# Patient Record
Sex: Male | Born: 1940 | Race: White | Hispanic: No | Marital: Married
Health system: Southern US, Community
[De-identification: ages and names within clinical notes are randomized; demographics above are authoritative.]

---

## 2014-08-22 ENCOUNTER — Inpatient Hospital Stay: Payer: Self-pay | Admitting: Surgery

## 2014-08-22 LAB — URINALYSIS, COMPLETE
BACTERIA: NONE SEEN
BILIRUBIN, UR: NEGATIVE
Glucose,UR: >=500 mg/dL (ref 0–75)
KETONE: NEGATIVE
LEUKOCYTE ESTERASE: NEGATIVE
Nitrite: NEGATIVE
PROTEIN: NEGATIVE
Ph: 6 (ref 4.5–8.0)
RBC,UR: 1 /HPF (ref 0–5)
SPECIFIC GRAVITY: 1.007 (ref 1.003–1.030)
Squamous Epithelial: 1
WBC UR: <1 /HPF (ref 0–5)

## 2014-08-22 LAB — CBC WITH DIFFERENTIAL/PLATELET
BASOS ABS: 0 10*3/uL (ref 0.0–0.1)
BASOS PCT: 0.1 %
EOS PCT: 0.2 %
Eosinophil #: 0 10*3/uL (ref 0.0–0.7)
HCT: 43.6 % (ref 40.0–52.0)
HGB: 14.7 g/dL (ref 13.0–18.0)
LYMPHS PCT: 5.4 %
Lymphocyte #: 0.2 10*3/uL — ABNORMAL LOW (ref 1.0–3.6)
MCH: 32 pg (ref 26.0–34.0)
MCHC: 33.7 g/dL (ref 32.0–36.0)
MCV: 95 fL (ref 80–100)
Monocyte #: 0 x10 3/mm — ABNORMAL LOW (ref 0.2–1.0)
Monocyte %: 0.9 %
Neutrophil #: 3.8 10*3/uL (ref 1.4–6.5)
Neutrophil %: 93.4 %
PLATELETS: 168 10*3/uL (ref 150–440)
RBC: 4.6 10*6/uL (ref 4.40–5.90)
RDW: 14.1 % (ref 11.5–14.5)
WBC: 4 10*3/uL (ref 3.8–10.6)

## 2014-08-22 LAB — MAGNESIUM: Magnesium: 1.8 mg/dL

## 2014-08-22 LAB — COMPREHENSIVE METABOLIC PANEL
ALBUMIN: 4 g/dL (ref 3.4–5.0)
ALT: 123 U/L — AB
Alkaline Phosphatase: 208 U/L — ABNORMAL HIGH
Anion Gap: 12 (ref 7–16)
BILIRUBIN TOTAL: 4.1 mg/dL — AB (ref 0.2–1.0)
BUN: 26 mg/dL — ABNORMAL HIGH (ref 7–18)
CHLORIDE: 101 mmol/L (ref 98–107)
CREATININE: 1.51 mg/dL — AB (ref 0.60–1.30)
Calcium, Total: 8.8 mg/dL (ref 8.5–10.1)
Co2: 25 mmol/L (ref 21–32)
EGFR (African American): 52 — ABNORMAL LOW
GFR CALC NON AF AMER: 45 — AB
GLUCOSE: 229 mg/dL — AB (ref 65–99)
Osmolality: 288 (ref 275–301)
POTASSIUM: 2.4 mmol/L — AB (ref 3.5–5.1)
SGOT(AST): 250 U/L — ABNORMAL HIGH (ref 15–37)
Sodium: 138 mmol/L (ref 136–145)
Total Protein: 7.5 g/dL (ref 6.4–8.2)

## 2014-08-22 LAB — HEPATIC FUNCTION PANEL A (ARMC)
ALBUMIN: 4.1 g/dL (ref 3.4–5.0)
AST: 254 U/L — AB (ref 15–37)
Alkaline Phosphatase: 193 U/L — ABNORMAL HIGH
BILIRUBIN TOTAL: 3.6 mg/dL — AB (ref 0.2–1.0)
Bilirubin, Direct: 2.8 mg/dL — ABNORMAL HIGH (ref 0.00–0.20)
SGPT (ALT): 121 U/L — ABNORMAL HIGH
TOTAL PROTEIN: 7.4 g/dL (ref 6.4–8.2)

## 2014-08-22 LAB — LIPASE, BLOOD: Lipase: 118 U/L (ref 73–393)

## 2014-08-23 LAB — COMPREHENSIVE METABOLIC PANEL
ALT: 162 U/L — AB
Albumin: 2.9 g/dL — ABNORMAL LOW (ref 3.4–5.0)
Alkaline Phosphatase: 191 U/L — ABNORMAL HIGH
Anion Gap: 11 (ref 7–16)
BILIRUBIN TOTAL: 3.7 mg/dL — AB (ref 0.2–1.0)
BUN: 23 mg/dL — ABNORMAL HIGH (ref 7–18)
Calcium, Total: 8.6 mg/dL (ref 8.5–10.1)
Chloride: 116 mmol/L — ABNORMAL HIGH (ref 98–107)
Co2: 19 mmol/L — ABNORMAL LOW (ref 21–32)
Creatinine: 1.25 mg/dL (ref 0.60–1.30)
EGFR (African American): >60
GFR CALC NON AF AMER: 56 — AB
GLUCOSE: 132 mg/dL — AB (ref 65–99)
Osmolality: 296 (ref 275–301)
Potassium: 5.2 mmol/L — ABNORMAL HIGH (ref 3.5–5.1)
SGOT(AST): 166 U/L — ABNORMAL HIGH (ref 15–37)
Sodium: 146 mmol/L — ABNORMAL HIGH (ref 136–145)
Total Protein: 5.6 g/dL — ABNORMAL LOW (ref 6.4–8.2)

## 2014-08-23 LAB — CBC WITH DIFFERENTIAL/PLATELET
Basophil #: 0 10*3/uL (ref 0.0–0.1)
Basophil %: 0.1 %
EOS ABS: 0 10*3/uL (ref 0.0–0.7)
Eosinophil %: 0.3 %
HCT: 40 % (ref 40.0–52.0)
HGB: 13.1 g/dL (ref 13.0–18.0)
LYMPHS ABS: 0.7 10*3/uL — AB (ref 1.0–3.6)
Lymphocyte %: 8 %
MCH: 32.2 pg (ref 26.0–34.0)
MCHC: 32.7 g/dL (ref 32.0–36.0)
MCV: 99 fL (ref 80–100)
MONOS PCT: 3.2 %
Monocyte #: 0.3 x10 3/mm (ref 0.2–1.0)
NEUTROS ABS: 7.9 10*3/uL — AB (ref 1.4–6.5)
NEUTROS PCT: 88.4 %
PLATELETS: 108 10*3/uL — AB (ref 150–440)
RBC: 4.06 10*6/uL — ABNORMAL LOW (ref 4.40–5.90)
RDW: 15.4 % — AB (ref 11.5–14.5)
WBC: 9 10*3/uL (ref 3.8–10.6)

## 2014-08-23 LAB — HEPATIC FUNCTION PANEL A (ARMC): Bilirubin, Direct: 2.2 mg/dL — ABNORMAL HIGH (ref 0.00–0.20)

## 2014-08-24 LAB — CBC WITH DIFFERENTIAL/PLATELET
BASOS PCT: 0.3 %
Basophil #: 0 10*3/uL (ref 0.0–0.1)
EOS PCT: 0.2 %
Eosinophil #: 0 10*3/uL (ref 0.0–0.7)
HCT: 40.9 % (ref 40.0–52.0)
HGB: 13.7 g/dL (ref 13.0–18.0)
LYMPHS PCT: 7.8 %
Lymphocyte #: 0.5 10*3/uL — ABNORMAL LOW (ref 1.0–3.6)
MCH: 32.1 pg (ref 26.0–34.0)
MCHC: 33.4 g/dL (ref 32.0–36.0)
MCV: 96 fL (ref 80–100)
Monocyte #: 0.2 x10 3/mm (ref 0.2–1.0)
Monocyte %: 3.2 %
NEUTROS ABS: 6 10*3/uL (ref 1.4–6.5)
Neutrophil %: 88.5 %
Platelet: 111 10*3/uL — ABNORMAL LOW (ref 150–440)
RBC: 4.26 10*6/uL — ABNORMAL LOW (ref 4.40–5.90)
RDW: 15 % — AB (ref 11.5–14.5)
WBC: 6.8 10*3/uL (ref 3.8–10.6)

## 2014-08-24 LAB — BASIC METABOLIC PANEL
ANION GAP: 9 (ref 7–16)
BUN: 22 mg/dL — ABNORMAL HIGH (ref 7–18)
CREATININE: 1.26 mg/dL (ref 0.60–1.30)
Calcium, Total: 9.4 mg/dL (ref 8.5–10.1)
Chloride: 111 mmol/L — ABNORMAL HIGH (ref 98–107)
Co2: 23 mmol/L (ref 21–32)
EGFR (Non-African Amer.): 55 — ABNORMAL LOW
GLUCOSE: 141 mg/dL — AB (ref 65–99)
OSMOLALITY: 291 (ref 275–301)
Potassium: 4 mmol/L (ref 3.5–5.1)
Sodium: 143 mmol/L (ref 136–145)

## 2014-08-25 LAB — COMPREHENSIVE METABOLIC PANEL
AST: 40 U/L — AB (ref 15–37)
Albumin: 2.8 g/dL — ABNORMAL LOW (ref 3.4–5.0)
Alkaline Phosphatase: 167 U/L — ABNORMAL HIGH
Anion Gap: 8 (ref 7–16)
BILIRUBIN TOTAL: 1.3 mg/dL — AB (ref 0.2–1.0)
BUN: 23 mg/dL — AB (ref 7–18)
CHLORIDE: 110 mmol/L — AB (ref 98–107)
CO2: 25 mmol/L (ref 21–32)
Calcium, Total: 8.3 mg/dL — ABNORMAL LOW (ref 8.5–10.1)
Creatinine: 1.38 mg/dL — ABNORMAL HIGH (ref 0.60–1.30)
EGFR (African American): 58 — ABNORMAL LOW
EGFR (Non-African Amer.): 50 — ABNORMAL LOW
Glucose: 164 mg/dL — ABNORMAL HIGH (ref 65–99)
OSMOLALITY: 292 (ref 275–301)
POTASSIUM: 4 mmol/L (ref 3.5–5.1)
SGPT (ALT): 92 U/L — ABNORMAL HIGH
SODIUM: 143 mmol/L (ref 136–145)
TOTAL PROTEIN: 6.6 g/dL (ref 6.4–8.2)

## 2014-08-25 LAB — CBC WITH DIFFERENTIAL/PLATELET
BASOS ABS: 0 10*3/uL (ref 0.0–0.1)
Basophil %: 0.1 %
EOS ABS: 0 10*3/uL (ref 0.0–0.7)
Eosinophil %: 0.2 %
HCT: 38.4 % — ABNORMAL LOW (ref 40.0–52.0)
HGB: 12.7 g/dL — ABNORMAL LOW (ref 13.0–18.0)
LYMPHS PCT: 12.6 %
Lymphocyte #: 0.8 10*3/uL — ABNORMAL LOW (ref 1.0–3.6)
MCH: 31.5 pg (ref 26.0–34.0)
MCHC: 33 g/dL (ref 32.0–36.0)
MCV: 96 fL (ref 80–100)
Monocyte #: 0.3 x10 3/mm (ref 0.2–1.0)
Monocyte %: 5.2 %
NEUTROS ABS: 5.3 10*3/uL (ref 1.4–6.5)
Neutrophil %: 81.9 %
Platelet: 96 10*3/uL — ABNORMAL LOW (ref 150–440)
RBC: 4.02 10*6/uL — ABNORMAL LOW (ref 4.40–5.90)
RDW: 15 % — AB (ref 11.5–14.5)
WBC: 6.5 10*3/uL (ref 3.8–10.6)

## 2014-08-25 LAB — PATHOLOGY REPORT

## 2014-08-25 LAB — HEPATIC FUNCTION PANEL A (ARMC): BILIRUBIN DIRECT: 0.8 mg/dL — AB (ref 0.00–0.20)

## 2014-08-26 LAB — CBC WITH DIFFERENTIAL/PLATELET
BASOS ABS: 0 10*3/uL (ref 0.0–0.1)
Basophil %: 0.2 %
EOS PCT: 0.5 %
Eosinophil #: 0 10*3/uL (ref 0.0–0.7)
HCT: 39.9 % — ABNORMAL LOW (ref 40.0–52.0)
HGB: 13.4 g/dL (ref 13.0–18.0)
Lymphocyte #: 0.9 10*3/uL — ABNORMAL LOW (ref 1.0–3.6)
Lymphocyte %: 14.7 %
MCH: 31.9 pg (ref 26.0–34.0)
MCHC: 33.6 g/dL (ref 32.0–36.0)
MCV: 95 fL (ref 80–100)
Monocyte #: 0.6 x10 3/mm (ref 0.2–1.0)
Monocyte %: 9.1 %
NEUTROS PCT: 75.5 %
Neutrophil #: 4.8 10*3/uL (ref 1.4–6.5)
PLATELETS: 87 10*3/uL — AB (ref 150–440)
RBC: 4.2 10*6/uL — ABNORMAL LOW (ref 4.40–5.90)
RDW: 14.9 % — AB (ref 11.5–14.5)
WBC: 6.3 10*3/uL (ref 3.8–10.6)

## 2014-08-26 LAB — AMMONIA: Ammonia, Plasma: 11 mcmol/L (ref 11–32)

## 2014-08-26 LAB — COMPREHENSIVE METABOLIC PANEL
ALK PHOS: 156 U/L — AB
ANION GAP: 12 (ref 7–16)
AST: 36 U/L (ref 15–37)
Albumin: 3.1 g/dL — ABNORMAL LOW (ref 3.4–5.0)
BUN: 19 mg/dL — ABNORMAL HIGH (ref 7–18)
Bilirubin,Total: 1.6 mg/dL — ABNORMAL HIGH (ref 0.2–1.0)
CO2: 22 mmol/L (ref 21–32)
CREATININE: 1.23 mg/dL (ref 0.60–1.30)
Calcium, Total: 8.6 mg/dL (ref 8.5–10.1)
Chloride: 106 mmol/L (ref 98–107)
EGFR (Non-African Amer.): 57 — ABNORMAL LOW
Glucose: 127 mg/dL — ABNORMAL HIGH (ref 65–99)
Osmolality: 283 (ref 275–301)
Potassium: 3.7 mmol/L (ref 3.5–5.1)
SGPT (ALT): 72 U/L — ABNORMAL HIGH
Sodium: 140 mmol/L (ref 136–145)
Total Protein: 7 g/dL (ref 6.4–8.2)

## 2014-08-26 LAB — TSH: Thyroid Stimulating Horm: 3.14 u[IU]/mL

## 2014-08-27 LAB — CBC WITH DIFFERENTIAL/PLATELET
Basophil #: 0 10*3/uL (ref 0.0–0.1)
Basophil %: 0.4 %
Eosinophil #: 0 10*3/uL (ref 0.0–0.7)
Eosinophil %: 0.2 %
HCT: 43.7 % (ref 40.0–52.0)
HGB: 14.6 g/dL (ref 13.0–18.0)
LYMPHS ABS: 1 10*3/uL (ref 1.0–3.6)
LYMPHS PCT: 19.4 %
MCH: 31.7 pg (ref 26.0–34.0)
MCHC: 33.4 g/dL (ref 32.0–36.0)
MCV: 95 fL (ref 80–100)
MONO ABS: 0.5 x10 3/mm (ref 0.2–1.0)
Monocyte %: 9.7 %
Neutrophil #: 3.7 10*3/uL (ref 1.4–6.5)
Neutrophil %: 70.3 %
PLATELETS: 110 10*3/uL — AB (ref 150–440)
RBC: 4.62 10*6/uL (ref 4.40–5.90)
RDW: 14.9 % — ABNORMAL HIGH (ref 11.5–14.5)
WBC: 5.3 10*3/uL (ref 3.8–10.6)

## 2014-08-31 LAB — CULTURE, BLOOD (SINGLE)

## 2014-11-28 DEATH — deceased

## 2015-04-21 NOTE — H&P (Signed)
   Subjective/Chief Complaint Diffuse abdominal pain, N/V x 2 weeks, worsening of pain x 1 day   History of Present Illness [name] is a pleasant 74 yo M who is a poor historian, he says due to pain.  Per his story, he presents with 1 week of diffuse abdominal pain.  Has been going on for weeks per his report but worsened x 1 day.  In addition, has been reported to be nauseated  and vomiting for the past two weeks.  No fevers/chills.  H/o chronic opiate use.  Bilirubin elevated at 4.1 with elevation of LFT.  Denies drinking use now or prior.  H/o opiate abuse.  CT shows gallstones, 4.7 cm infrarenal aortic aneurysm.   Past History COPD GERD PVD DM type II Polyneuropathy H/o opiod abuse Gout H/o prostate ca H/o chronic constipation   Code Status Full Code   Past Med/Surgical Hx:  Tobacco Use--Former:   Polyneuropathy:   Nausea/Vomiting:   Insomnia:   Constipation:   GERD:   COPD:   Peripheral Vascular Disease:   Arteriosclerosis:   Hypertension:   Chronic Pain Syndrome:   Depression:   Opiod Abuse:   Gout:   Vitamin B12 Deficiency:   Diabetes Mellitus, Type II (NIDD):   Malignant Neoplasm of Prostate:   ALLERGIES:  Prozac: Other  Shellfish: Other  Family and Social History:  Family History Unable to obtain   Social History negative tobacco, negative ETOH   Review of Systems:  Subjective/Chief Complaint Diffuse abdominal pain, nausea/vomiting   Fever/Chills No   Cough No   Sputum No   Abdominal Pain Yes   Diarrhea No   Constipation No   Nausea/Vomiting Yes   SOB/DOE No   Chest Pain No   Dysuria No   Tolerating Diet Nauseated  Vomiting   Physical Exam:  GEN well developed, well nourished, no acute distress, disheveled, critically ill appearing   HEENT pink conjunctivae, PERRL, good dentition   RESP normal resp effort  clear BS  no use of accessory muscles   CARD regular rate  no murmur   ABD positive tenderness  no hernia  soft  rigid   normal BS  Diffusely tender bilateral upper quadrants   EXTR negative cyanosis/clubbing, negative edema   SKIN normal to palpation, No rashes, No ulcers, skin turgor good   NEURO cranial nerves intact, negative rigidity, negative tremor, cog wheel, strength:   PSYCH A+O to time, place, person, poor insight    Assessment/Admission Diagnosis [name] is a pleasant 74 yo M who presents with worsening diffuse abdominal pain, nausea/vomiting and gallstones on CT scan.  LFT elevated.  Concern for choledocholithiasis vs cholangitis.   Plan Admit, hydrate.  IV antibiotics.  Labs in am.  GI consult if still elevated LFT in am.  Will likely require prophylactic cholecystectomy after duct status determined.   Electronic Signatures: Jarvis NewcomerLundquist, Reann Dobias A (MD)  (Signed 25-Aug-15 05:31)  Authored: CHIEF COMPLAINT and HISTORY, PAST MEDICAL/SURGIAL HISTORY, ALLERGIES, FAMILY AND SOCIAL HISTORY, REVIEW OF SYSTEMS, PHYSICAL EXAM, ASSESSMENT AND PLAN   Last Updated: 25-Aug-15 05:31 by Jarvis NewcomerLundquist, Maziah Keeling A (MD)

## 2015-04-21 NOTE — Consult Note (Signed)
Pt HIDA scan showed no uptake by the gall bladder in 5 hours with flow into CBD and intestine.  US showed 5mm thick wall of gall bladder.  Some slowness to liver up take.  With the distention of GB on CT and abnormal HIDA and thick wall he likely has acute/chronic cholecystitis.  I spoke to patient of the improtance of getting the rest of his study done and he agreed to have it done.  No evidence on CT or US of CBD stone/dilatation.  Electronic Signatures: Scot JunElliott, Robert T (MD)  (Signed on 26-Aug-15 16:52)  Authored  Last Updated: 26-Aug-15 16:52 by Scot JunElliott, Robert T (MD)

## 2015-04-21 NOTE — Consult Note (Signed)
Spoke to patient wife who was on the phone in his room when I came by, she denies any alcohol history for this pateint,, said patient appeared to be talking out of his head.  She denies any hx of him having  liver disease.  She gave phone numbers where she could be reached and they were written on top of white board in his room.  Electronic Signatures: Scot JunElliott, Robert T (MD)  (Signed on 26-Aug-15 17:32)  Authored  Last Updated: 26-Aug-15 17:32 by Scot JunElliott, Robert T (MD)

## 2015-04-21 NOTE — Consult Note (Signed)
PATIENT NAME:  Jacob Macias, Jacob Macias MR#:  [id number] DATE OF BIRTH:  Sep 07, 1941  DATE OF CONSULTATION:  08/22/2014  REFERRING PHYSICIAN:  Cristal Deer A. Lundquist, MD CONSULTING PHYSICIAN:  Starleen Arms, MD   PRIMARY CARE PHYSICIAN: [facility].   REASON FOR MEDICAL CONSULTATION: Medical management.   HISTORY OF PRESENT ILLNESS: This is a 74 year old male who presents to [facility] from an assisted living facility for complaints of abdominal pain. The patient is a very poor historian and cannot give any reliable history. As well, this is the patient's first visit here, where we do not have any previous records on the patient. History was obtained mainly from consulting physician, ED staff and the patient's wife. The patient was recently discharged from [facility] on 08/25/2014, after hospitalization there for weakness, as per wife. She does not know the exact details of his stay or diagnosis. He was discharged to assisted living facility, where the patient was complaining of abdominal pain. The patient had previously been having abdominal pain for a few weeks, but it was worsening over the last day, which prompted the facility to call EMS. Upon presentation, the patient had basic blood work done, which did show elevated LFTs. A mildly elevated total bilirubin of 4.1 and alkaline phosphatase of 208. AST of 250 and ALT of 123. As well, the patient had significant hypokalemia at 2.4 and did not have any leukocytosis. Was afebrile. Urinalysis was negative. The patient had CT abdomen and pelvis, which did show a distended gallbladder with gallstones. There was no mentioning of any common bile duct . Surgical was consulted out of concern of cholecystitis. They saw the patient, and there were concerned about choledocholithiasis versus cholangitis, where they admitted the patient and started him on IV Zosyn, and hospitalist service was requested to see the patient for medical management.  The patient denies any chest pain, any shortness of breath. Complains of generalized weakness and fatigue. Denies fever, chills. At this point, wife reports patient the patient has been ambulating at baseline with a walker, but recently he has been only ambulating via wheelchair.   PAST MEDICAL HISTORY:  1.  Former tobacco use.  2.  Polyneuropathy.  3.  Insomnia.  4.  Constipation.  5.  GERD.  6.  COPD.  7.  Peripheral vascular disease.  8.  Atherosclerosis.  9.  Hypertension.  10.  Chronic pain syndrome.  11.  Depression.  12.  Opioid abuse.  13.  Gout.  14.  Vitamin B12 deficiency.  15.  Type 2 diabetes.  16.  History of malignant neoplasm of the prostate, status post radiation, before 10 years at [facility] as per his wife.   ALLERGIES: PROZAC AND SHELLFISH.   FAMILY HISTORY: Unable to obtain from this patient at this point, as he is a very poor historian.   SOCIAL HISTORY: The patient has history of tobacco abuse in the past. No alcohol. No illicit drug use.   REVIEW OF SYSTEMS:  CONSTITUTIONAL: The patient has very poor historian.  His review of systems was obtained to my best.  GENERAL: Denies fever, chills, fatigue, weakness.  EYES: Denies blurry vision, double vision, inflammation.  ENT: Denies tinnitus, ear pain, hearing loss, epistaxis.  RESPIRATORY: Denies cough, productive sputum, hemoptysis.  CARDIOVASCULAR: Denies chest pain, edema, palpitation.  GASTROINTESTINAL: Reports nausea, 1 episode of vomiting, abdominal pain, constipation. Denies diarrhea.  GENITOURINARY: Denies dysuria, hematuria, or renal colic.  ENDOCRINE: Denies polyuria, polydipsia, heat or cold intolerance.  HEMATOLOGY: Denies  anemia, easy bruising, bleeding diathesis. INTEGUMENT:  Denies acne, rash, or skin lesion.  MUSCULOSKELETAL: Reports history of gout. Denies any cramps. Reports history of arthritis.  NEUROLOGIC: Denies any focal deficits, tingling, numbness, headache, lightheadedness.   PSYCHIATRIC: Denies anxiety, insomnia, depression.   PHYSICAL EXAMINATION:  VITAL SIGNS: Temperature 98.5, pulse 106, respiratory rate 36, blood pressure 163/90, saturating 96% on room air.  GENERAL: Frail, elderly male, who looks comfortable in bed, in no apparent distress, sleeping comfortably.  HEENT: Head atraumatic, normocephalic. Pupils equal, reactive to light. Icteric sclerae. Pink conjunctivae. Dry oral mucosa. No oral thrush. No pharyngeal erythema.  NECK: Supple. No thyromegaly. No JVD. No carotid bruits.  CHEST: Good air entry bilaterally. No wheezing, rales or rhonchi.  CARDIOVASCULAR: S1, S2 heard. No rubs, murmur or gallops. PMI nondisplaced.  ABDOMEN: Diffuse tenderness to palpation. Bowel sounds present , but no rebound.  EXTREMITIES: No edema. No clubbing. No cyanosis. Pedal pulses felt bilaterally.  SKIN: Normal skin turgor. Warm and dry.  PSYCHIATRIC: The patient is awake, alert, oriented to name. He knows that he is in the hospital, but he [facility]. He knows the year; cannot recall the month. He appears to be confused.  NEUROLOGIC: Cranial nerves grossly intact. Motor appears to be nonfocal. Sensation is symmetrical to light touch.  MUSCULOSKELETAL: No joint effusion or erythema.   PERTINENT LABORATORY DATA: Glucose 229, BUN 26, creatinine 1.51, sodium 138, potassium 2.4, chloride 101, CO2 of 25, ALT 123, AST 250, alkaline phosphatase 208, total bilirubin 4.1. White blood cells 4, hemoglobin 14.7, hematocrit 43.6, platelets 168,000. Urinalysis negative for leukocyte esterase and nitrite. CT abdomen and pelvis showing gallbladder distention and cholelithiasis without sonographic evidence of acute cholecystitis.   ASSESSMENT AND PLAN:  1.  Abdominal pain with elevated liver function tests; this is most likely related to choledocholithiasis versus cholangitis.  Management as per primary surgical team. He is on IV Zosyn. GI were consulted as well. The  patient will be kept n.p.o. with IV fluid hydration.  2.  Hypokalemia. The patient is on normal saline with 40 of potassium. We will check magnesium level and will monitor closely.  3.  Chronic obstructive pulmonary disease. The patient does not have active wheezing. We will continue with p.r.n. albuterol.  4.  Gastroesophageal reflux disease. Continue with proton pump inhibitor.  5.  Diabetes mellitus. I do not know why he does not appear to be having any insulin on his nursing home medications, but as per wife, the patient is on Humulin. We will keep him on insulin sliding scale every 6 hours as long as he is n.p.o. He can be transitioned to before meals, once he tolerates p.o. intake.  6.  History of polyneuropathy. Continue with gabapentin.  7.  Gout. Continue with allopurinol.  8.  Deep vein thrombosis prophylaxis. Subcutaneous heparin.   CODE STATUS: Discussed with wife. The patient is Full Code.   The case was discussed with the patient's wife, Mrs. Jacob Macias,  phone number is [phone].   TOTAL TIME SPENT ON MEDICAL CONSULTATION: 50 minutes     ____________________________ Starleen Armsawood S. Hollye Pritt, MD dse:MT D: 08/22/2014 07:38:52 ET T: 08/22/2014 13:46:05 ET JOB#: [id number]  cc: Starleen Armsawood S. Seferina Brokaw, MD, <Dictator> Saryah Loper Teena IraniS Lanyia Jewel MD ELECTRONICALLY SIGNED 08/24/2014 0:48

## 2015-04-21 NOTE — Consult Note (Signed)
Brief Consult Note: Diagnosis: elevated bilirubin, abdominal pain.   Patient was seen by consultant.   Consult note dictated.   Comments: Appreciate consult for 74 y/o caucasian man admitted for diffuse abdominal pain, intermittent NV over the last 2w. Recently admitted at [facility] for unknown reason recently due to weakness and frequent falls, has been residing at [facility] for short term rehab. Patient is difficult historian, wife is difficult historian- so history is gathered mostly from chart review. States that he really hasn't had any abdominal issues in the past, although has a history of gallstones- but apparently these have been asymptomatic. Evidently he has been having abdominal pain on and off for the last 1-2w, accompanied by intermittent NV. He is unable to describe this further, although is complaining of heartburn and some LUQ pain presently, feels like a mountain dew would make it better. Cannot describe bowel movements or offer other history at present, wife unsure as well. On exam abd very soft, I cannot elicit any tenderness. he is mildly jaundiced. Currently receiving IV Zosyn and IV Zantac- do note on his medication list he is on Dexilant for GERD. Impression and plan: GERD- will change Zantac to Pantoprazole 40mg  IV bid. Hyperbilirubinemia/elevated LFTs: will repeat liver panel now and schedule US for further ductal evaluation- may need MRCP v. ERCP. Noncontrasted CT does not offer great view of ductal system. After discussion with Dr Markham JordanElliot- will plan for HIDA in am, given his history of distended gallbladder and stones likely has some cystic duct obstruction, possible acalculous cholecystitis..  Electronic Signatures: Vevelyn PatLondon, Keitha Kolk H (NP)  (Signed 25-Aug-15 20:31)  Authored: Brief Consult Note   Last Updated: 25-Aug-15 20:31 by Keturah BarreLondon, Blessin Kanno H (NP)

## 2015-04-21 NOTE — Discharge Summary (Signed)
PATIENT NAME:  Jacob Macias, Jacob Macias MR#:  [id number] DATE OF BIRTH:  [birth year]  DATE OF ADMISSION:  08/22/2014 DATE OF DISCHARGE:  08/28/2014  BRIEF HISTORY: Jacob Macias is a 74 year old gentleman admitted to the hospital with abdominal pain on August 25. He was admitted by the general surgery service. He is a very poor historian, but felt like his symptoms had been going on for approximately a week to 10 days. He has a longstanding history of chronic narcotic usage for back pain. He does have a history of some mild dementia. His bilirubin was elevated at 4.1, with elevated LFTs. CT scan showed multiple gallstones, a 4 cm infrarenal abdominal aortic aneurysm, and a 2 cm iliac aneurysm.  HOSPITAL COURSE: Seen by the GI service, who recommended followup with a HIDA scan. The patient refused HIDA scan. Because of his persistent symptoms, he was taken to surgery on August 27, where he underwent laparoscopic cholecystectomy with cholangiography. He did have acute cholecystitis. The procedure was uncomplicated. He began to improve very rapidly. He did have some mild pain control issues because of his chronic pain medicine usage. However, his symptoms became less significant and he was set up for discharge on August 31. He was discharged to a skilled nursing facility.   DISCHARGE MEDICATIONS: Include allopurinol 100 mg 2 tablets once a day, amlodipine 10 mg once a day, aspirin 325 mg once a day, buspirone 10 mg 3 times a day, cholecalciferol 1000 units 2 tablets once a day, Dexilant 60 mg b.i.d., fentanyl 100 mcg per hour transdermal patch every 72 hours, Effexor XR 37.5 mg once a day, Flomax 0.4 mg once a day, furosemide 20 mg 2 times a day, gabapentin 100 mg 2 tablets 3 times a day, melatonin 3 mg once a day, MiraLax p.r.n., nicotine 14/24 patch once a day, senna 8.6 mg 2 tablets once a day, trazodone 50 mg once a day, Ventolin HFA 90 mcg 2 puffs every 6 hours p.r.n.,  and vitamin B12.  He was advised to stop taking his  Fioricet, Dilaudid, promethazine, and Zofran.   FINAL DISCHARGE DIAGNOSIS: Acute cholecystitis.   SURGERY: Laparoscopic cholecystectomy with cholangiography.   ____________________________ Carmie Endalph L. Ely III, MD rle:ST D: 09/04/2014 21:10:31 ET T: 09/04/2014 21:40:29 ET JOB#: [id number]  cc: Carmie Endalph L. Ely III, MD, <Dictator> Teena Iraniavid M. Terance HartBronstein, MD Quentin OreALPH L ELY MD ELECTRONICALLY SIGNED 09/05/2014 20:47

## 2015-04-21 NOTE — Consult Note (Signed)
PATIENT NAME:  Jacob Macias, Jacob Macias MR#:  [id number] DATE OF BIRTH:  1941-02-05  DATE OF CONSULTATION:  08/22/2014  REFERRING PHYSICIAN:  Cristal Deer A. Lundquist, MD CONSULTING PHYSICIAN:  Keturah Barre, NP  REASON FOR CONSULTATION: GI consult ordered by Dr. Juliann Pulse for evaluation of choledocholithiasis.    HISTORY OF PRESENT ILLNESS: I appreciate consult for this 74 year old Caucasian man admitted for diffuse abdominal pain and acute nausea and vomiting over the last 2 weeks, recently admitted at  [facility] for unknown reason, likely due to weakness and frequent falls, per his wife. He has been residing at [facility] for short-term rehabilitation. The patient and wife are difficult historians, so history is obtained from chart review. He states that he really has not had any abdominal issues in the past, although has a history of gallstones, but apparently these have been asymptomatic. He has been having abdominal pain on and off for the last 1 to 2 weeks accompanied by intermittent nausea and vomiting. He is unable to describe further, although is currently complaining of heartburn and left upper quadrant abdominal pain presently. Feels like a [employer] would make it better. He cannot describe bowel movements or any other history at present, wife unsure as well. Currently, receiving IV Zosyn, IV Zantac. I do note on his medication list, he is on Dexilant for GERD. I do note elevated bilirubin, AST and ALT. There is no history of alcohol abuse, prior history of liver disease and no family history of gallbladder or liver disease.   PAST MEDICAL HISTORY: Significant for COPD, GERD, PVD, diabetes, polyneuropathy, opioid abuse, gout, prostate cancer, chronic constipation, insomnia, hypertension, chronic pain syndrome, depression, B12 deficiency.   ALLERGIES: PROZAC, SHELLFISH.   FAMILY HISTORY: States no history of gallbladder or liver issues. Otherwise, unable to elicit further.   SOCIAL  HISTORY: No tobacco, alcohol, illicits.  REVIEW OF SYSTEMS: Significant for skin tears occurring prior to admission, generalized weakness, falls, inability to ambulate, has been using a walker at home; otherwise, unremarkable at present.   LABORATORY DATA: Most recent laboratories: Glucose 229, BUN 26, creatinine 1.5, sodium 138, potassium 2.4, chloride 101, GFR 45, calcium 8.8, magnesium 1.8, lipase 118. Total protein 7.4, albumin 4.1, total bilirubin 3.6, direct bilirubin 2.8,  alkaline phosphatase 193, AST 254, ALT 121. WBC 4.0, hemoglobin 14.7, hematocrit 43.6, platelet count 168,000. Red cells normocytic. RDW normal. CT with noncontrasted with small to moderate hiatal hernia, small and large bowel normal in course and caliber, diverticulosis, no intraperitoneal or free fluid. There was a 4.7 infrarenal aortic aneurysm that was calcified. Liver, spleen, pancreas unremarkable. Gallbladder was distended with dependent subcentimeter gallstones. No superimposed CT findings of acute cholecystitis. I do note some L2, L3 disk degeneration, some chronic lower back changes.   PHYSICAL EXAMINATION:  VITAL SIGNS: Most recent: Temperature 98.8, pulse 99, respiratory rate 20, blood pressure 138/79, SaO2 of 96% on room air.  GENERAL: Somewhat ill-appearing man in no acute distress, somewhat disheveled.  HEENT: Normocephalic, atraumatic. Scleral icterus. Conjunctivae pink.  NECK: Supple. No lymphadenopathy or JVD.  CHEST: Respirations eupneic. Lungs clear with decreased bilateral bases.  CARDIAC: S1, S2, RRR. No MRG. Trace generalized edema.  ABDOMEN: Bowel sounds x 4, very soft. Unable to elicit any tenderness. No hepatosplenomegaly, masses, guarding, rigidity or peritoneal signs.  SKIN: Warm, dry, pink. Several scattered bandages, presumably for skin tears. No erythema or rash.  NEUROLOGIC: Alert, oriented x 2. Cranial nerves II-XII appear to be intact. Speech clear. No facial droop. Somewhat tangential.  Limited insight.  PSYCHIATRIC: Calm, reasonably cooperative.   IMPRESSION AND PLAN: For his complaints of heartburn and left upper quadrant abdominal discomfort, his medication indicates that he takes Dexilant for his reflux disease, so we will change his Zantac to pantoprazole 40 mg IV b.i.d., this may be more efficacious for him. For his hyperbilirubinemia and elevated LFTs, we will schedule ultrasound for further ductal evaluation. CT does not offer a great view of the ductal system in a noncontrasted state. After discussion with Dr. Mechele CollinElliott, we will plan for HIDA in a.m. Given his history of distended gallbladder and stones, he likely has some cystic duct obstruction, possible acalculous cholecystitis. His fever on admission seems to be improving after the administration of antibiotics, so I would agree with these as well.   Thank you for this consult.    These services were provided by Vevelyn Pathristiane Damyiah Moxley, MSN, [facility], in collaboration with Scot Junobert T. Elliott, MD with whom I have discussed this patient in full.    ____________________________ Keturah Barrehristiane H. Safiatou Islam, NP chl:TT D: 08/22/2014 20:45:23 ET T: 08/22/2014 21:16:54 ET JOB#: [id number]  cc: Keturah Barrehristiane H. Printice Hellmer, NP, <Dictator> Eustaquio MaizeHRISTIANE H Diona Peregoy FNP ELECTRONICALLY SIGNED 08/30/2014 15:41

## 2015-04-21 NOTE — Consult Note (Signed)
Radiology dept had concerns that doing the HIDA scan soon after patient got narcotic pain medicine and while on Fentanyl patch would possible produce a false negative scan by effects on Sphincter of Oddi causing the gall bladder to take up the dye and give a false neg result.  If the test is positive and no uptake is done by the gall bladder then there is no problem with diagnosis and this will demonstrate a sick gall bladder.  Electronic Signatures: Scot JunElliott, Carrol Bondar T (MD)  (Signed on 26-Aug-15 08:41)  Authored  Last Updated: 26-Aug-15 08:41 by Scot JunElliott, Haygen Zebrowski T (MD)
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
## 2015-04-21 NOTE — Consult Note (Signed)
I will sign off, reconsult if I can be of service.  Electronic Signatures: Scot JunElliott, Niel Peretti T (MD)  (Signed on 27-Aug-15 18:35)  Authored  Last Updated: 27-Aug-15 18:35 by Scot JunElliott, Orva Riles T (MD)

## 2015-04-21 NOTE — Consult Note (Signed)
Pt with RUQ pain and tenderness.  Plan HIDA scan tomorrow and if positive consider gall bladder removal.  Electronic Signatures: Scot JunElliott, Florenda Watt T (MD)  (Signed on 25-Aug-15 19:54)  Authored  Last Updated: 25-Aug-15 19:54 by Scot JunElliott, Sorcha Rotunno T (MD)

## 2015-08-16 IMAGING — US ABDOMEN ULTRASOUND
1 series · 13 of 25 positions shown · non-contrast
Comparison: 08/22/2014

CLINICAL DATA: Abdominal pain, elevated LFTs

EXAM:
ULTRASOUND ABDOMEN COMPLETE

[Series 1: abdomen ultrasound · 0.29mm/px · 13 of 100 slices shown]
[im 1/100]
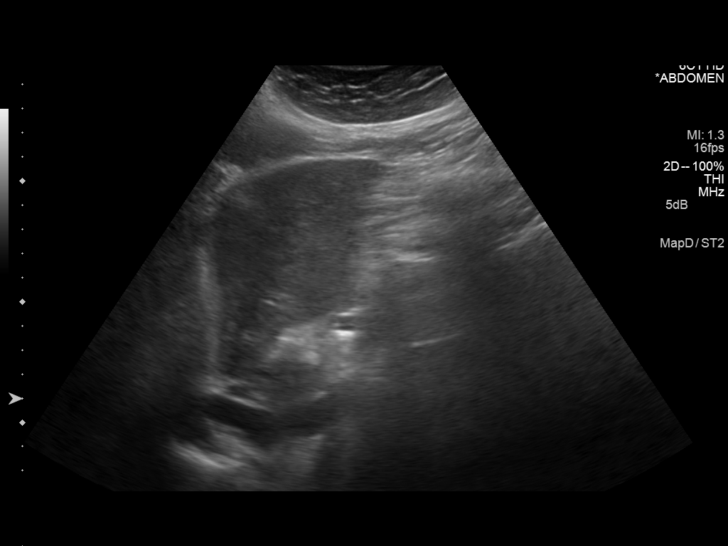
[im 9/100]
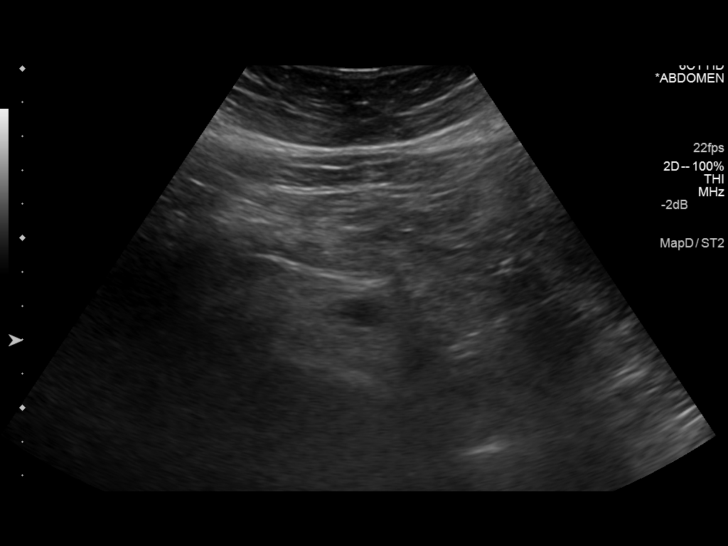
[im 17/100]
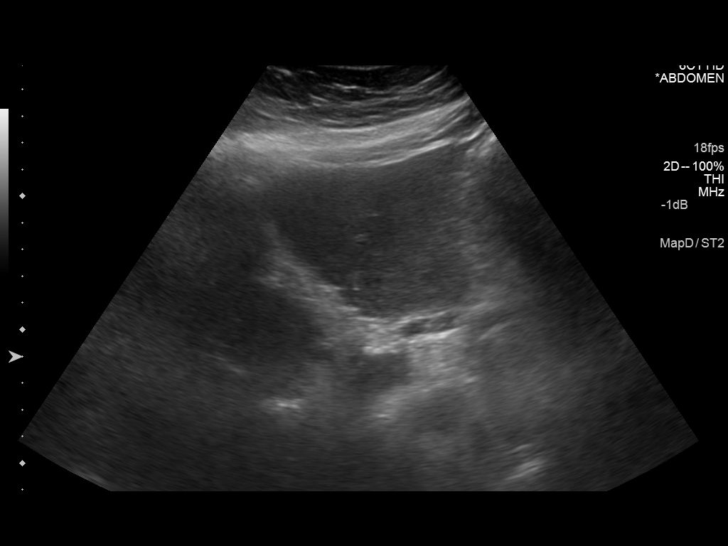
[im 25/100]
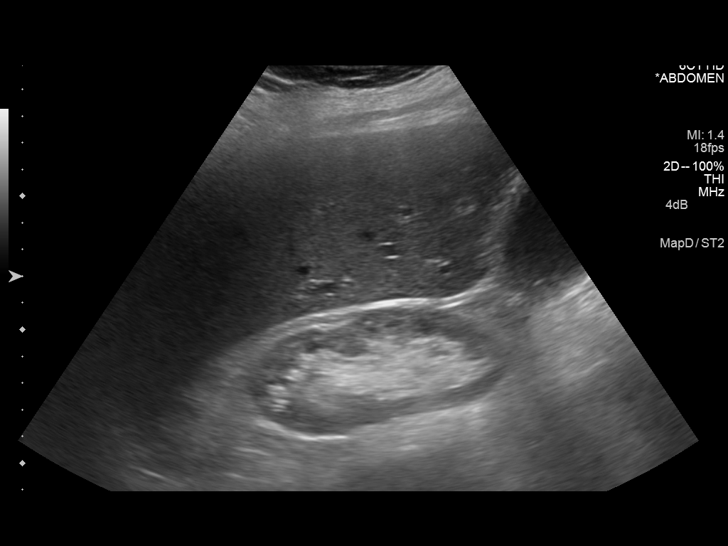
[im 34/100]
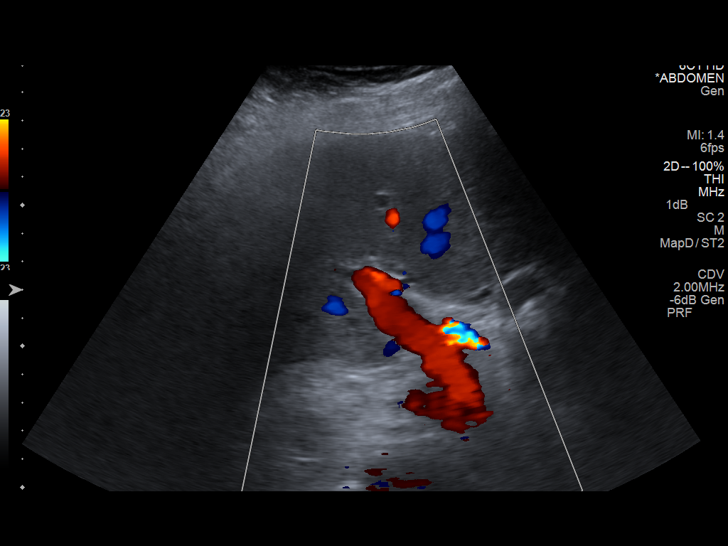
[im 42/100]
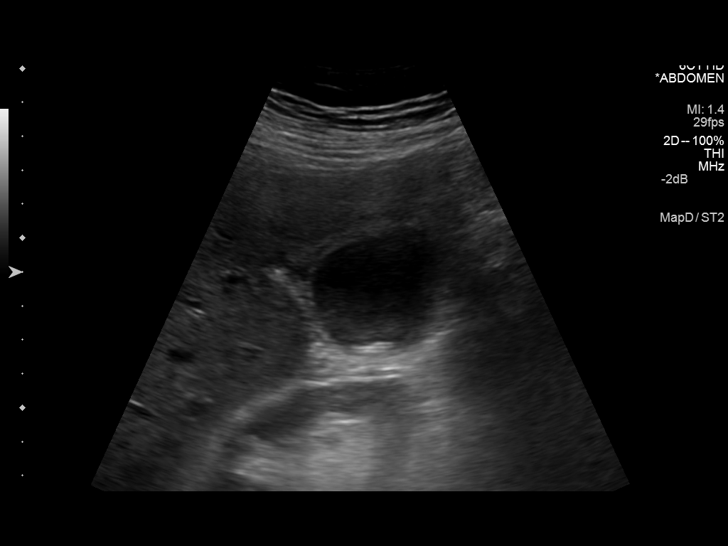
[im 50/100]
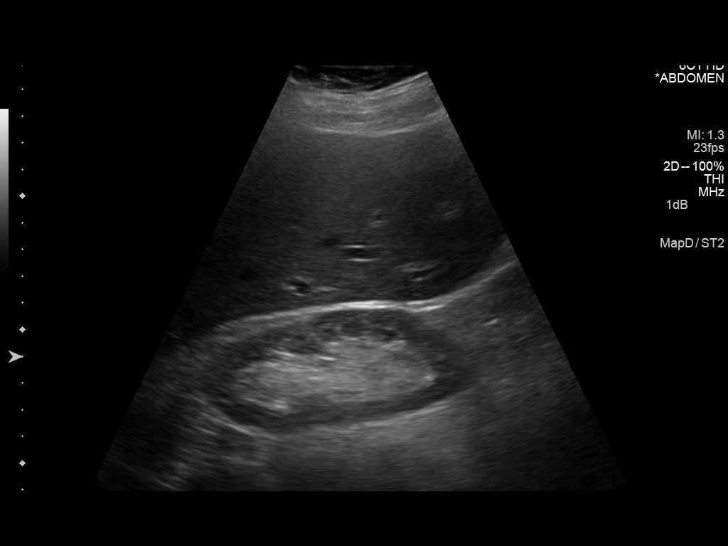
[im 58/100]
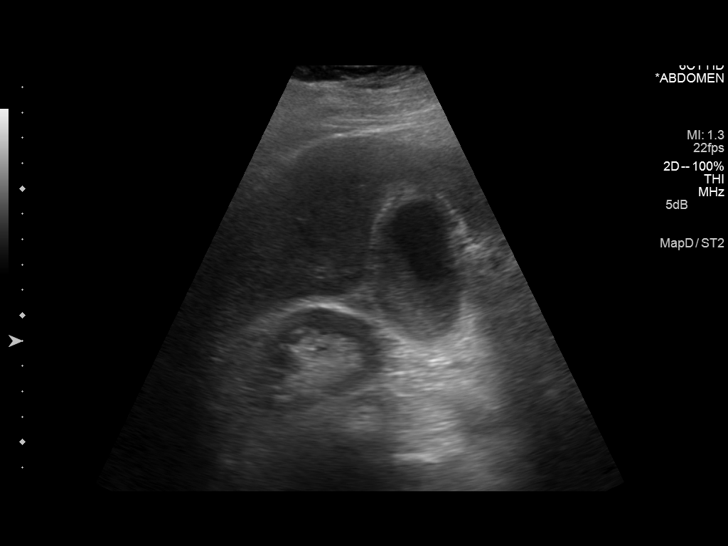
[im 67/100]
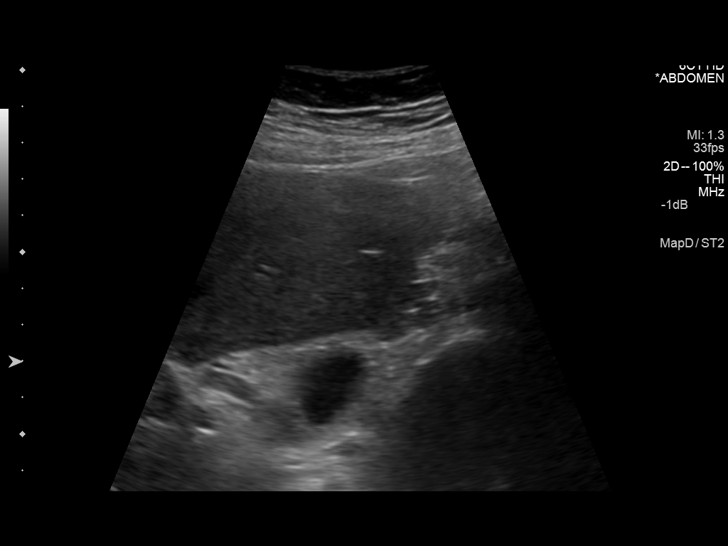
[im 75/100]
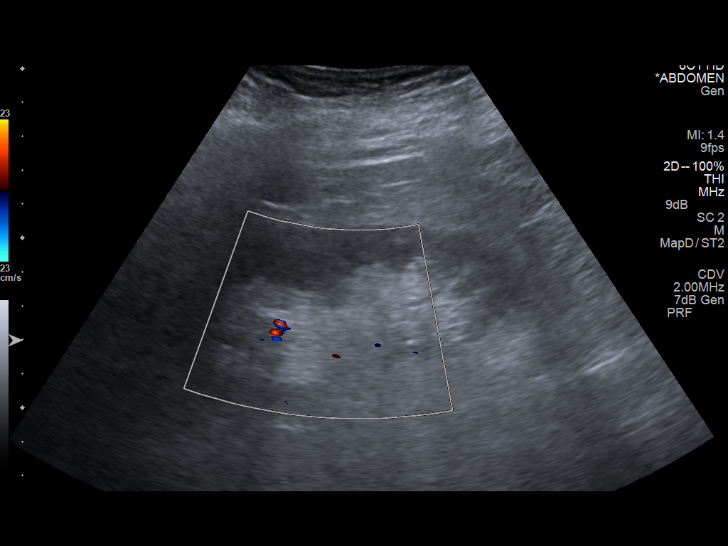
[im 83/100]
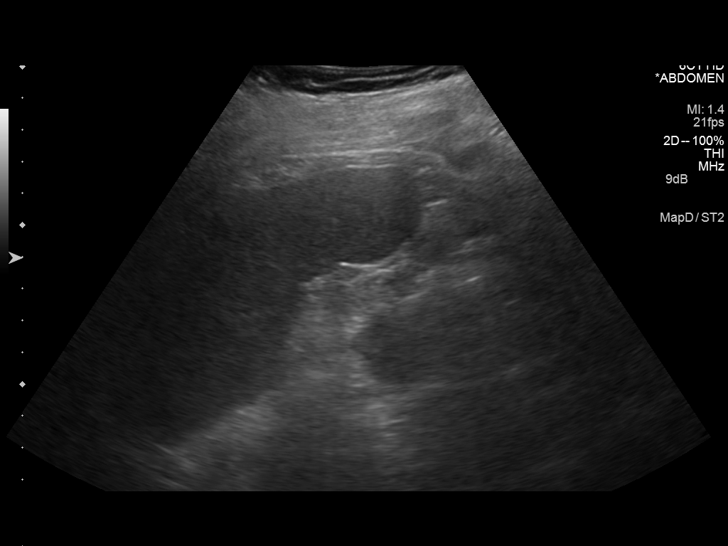
[im 91/100]
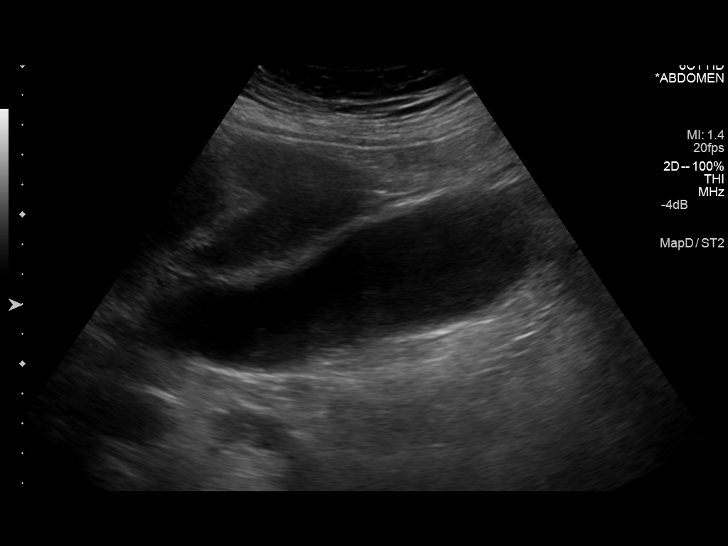
[im 100/100]
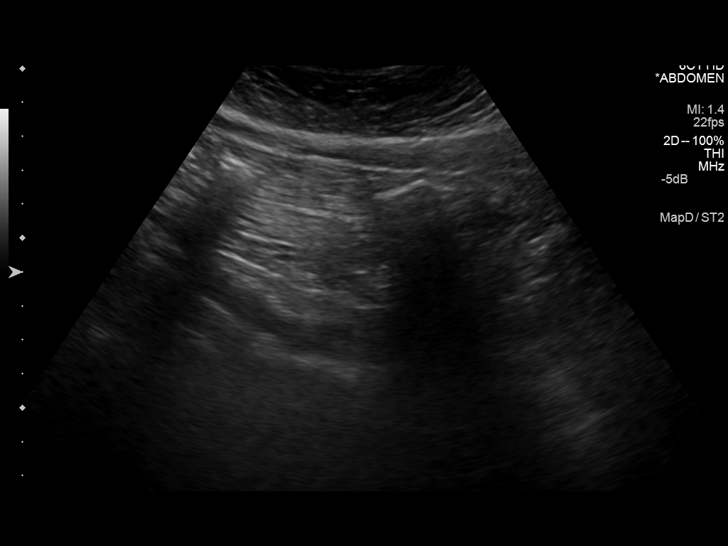

[13 of 25 positions shown; findings below may reference images not displayed]

FINDINGS: Gallbladder:

Gallbladder sludge and small gallstones are noted within
gallbladder. There is thickening of gallbladder wall up to 5.3 mm.
No sonographic Murphy's sign.

Common bile duct:

Diameter: 4 mm in diameter within normal limits.

Liver:

No focal lesion identified. Within normal limits in parenchymal
echogenicity.

IVC:

No abnormality visualized.

Pancreas:

Not visualized due to bowel gas

Spleen:

Size and appearance within normal limits.  Measures 7.7 cm in length

Right Kidney:

Length: 10.3 cm.  No hydronephrosis or diagnostic renal calculus.

Left Kidney:

Length: 10.9 cm.. No hydronephrosis. A calcified calculus in upper
pole measures 6.5 mm.

Abdominal aorta:

There is aneurysm of abdominal aorta measures 4.3 cm in diameter.

Other findings:

None.
IMPRESSION: 1. Sludge and layering small gallstones are noted within
gallbladder. There is thickening of gallbladder wall without
sonographic Murphy's sign.
2. Normal CBD.
3. No hydronephrosis. Calcified calculus upper pole of the left
kidney measures 6.5 mm.
4. There is aneurysm of abdominal aorta measures 4.3 cm in diameter.
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
## 2015-08-16 IMAGING — NM NUCLEAR MEDICINE HEPATOHBILIARY INCLUDE GB
3 series · 14 of 14 positions shown · non-contrast
Comparison: CT 08/22/2014, ultrasound 08/23/2014

CLINICAL DATA: Elevated bilirubin. Cholelithiasis. Patient received
Dilaudid prior exam

EXAM:
NUCLEAR MEDICINE HEPATOBILIARY IMAGING
TECHNIQUE: Sequential images of the abdomen were obtained [DATE] minutes
following intravenous administration of radiopharmaceutical.
RADIOPHARMACEUTICALS:  8.3 Millicurie 9c-SSm Choletec

[Series 1000: gallbladder statics · 4.80mm/px · 12 of 12 slices shown (1 of 2)]
[im 1/12]
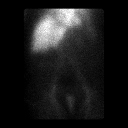
[im 2/12]
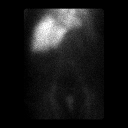
[im 3/12]
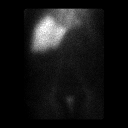
[im 4/12]
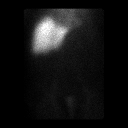
[im 5/12]
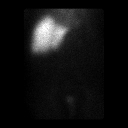
[im 6/12]
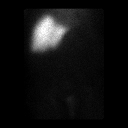
[im 7/12]
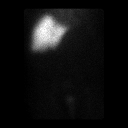
[im 8/12]
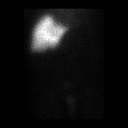
[im 9/12]
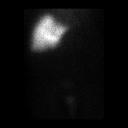
[im 10/12]
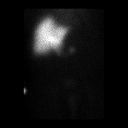
[im 11/12]
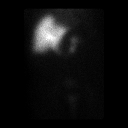
[im 12/12]
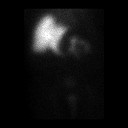

[Series 1000: 5 hour gb save_screens · 1 of 1 slices shown]
[im 1/1]
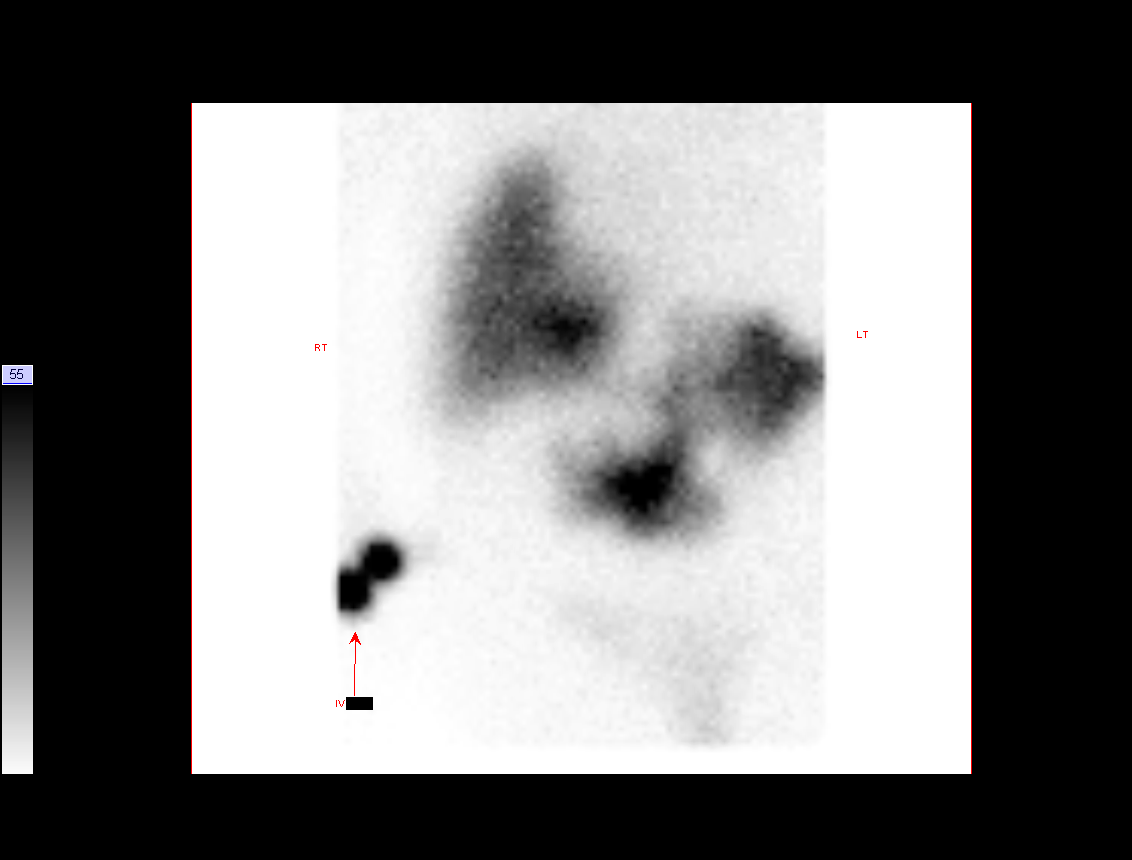

[Series 1000: gallbladder statics · 4.80mm/px · 1 of 1 slices shown (2 of 2)]
[im 1/1]
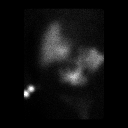

[14 of 14 positions shown; findings below may reference images not displayed]

FINDINGS: Poor initial clearance of the radiotracer from the blood pool. There
is retention activity in the liver over the first hour. Counts are
evident within the small bowel by 70 min. The gallbladder was not
visualized by 82min. Patient was imaged at 5 hr. The gallbladder was
not demonstrated at this delayed time . Counts progressed into the
bowel.
IMPRESSION: 1. Nonvisualization of the gallbladder over 5 hour interval is
concerning for obstruction of cystic duct (acute cholecystitis). Of
note, false-positive can occur with administration of opioid..
2. Retention of counts within the liver could relate to cholestasis,
hepatitis or opioid. Common bile duct is patent.

Findings conveyed Mb [facility] on 08/23/2014  at[DATE].
